# Patient Record
Sex: Male | Born: 1970 | Race: White | Hispanic: No | Marital: Married | Smoking: Never smoker
Health system: Southern US, Community
[De-identification: ages and names within clinical notes are randomized; demographics above are authoritative.]

## PROBLEM LIST (undated history)

## (undated) DIAGNOSIS — G43909 Migraine, unspecified, not intractable, without status migrainosus: Secondary | ICD-10-CM

## (undated) DIAGNOSIS — B019 Varicella without complication: Secondary | ICD-10-CM

## (undated) DIAGNOSIS — R519 Headache, unspecified: Secondary | ICD-10-CM

## (undated) DIAGNOSIS — R51 Headache: Secondary | ICD-10-CM

## (undated) DIAGNOSIS — R12 Heartburn: Secondary | ICD-10-CM

## (undated) HISTORY — DX: Heartburn: R12

## (undated) HISTORY — DX: Headache: R51

## (undated) HISTORY — DX: Headache, unspecified: R51.9

## (undated) HISTORY — PX: WISDOM TOOTH EXTRACTION: SHX21

## (undated) HISTORY — DX: Varicella without complication: B01.9

## (undated) HISTORY — DX: Migraine, unspecified, not intractable, without status migrainosus: G43.909

---
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
## 1993-01-24 HISTORY — PX: EYE SURGERY: SHX253

## 2008-12-22 ENCOUNTER — Ambulatory Visit: Payer: Self-pay | Admitting: Ophthalmology

## 2015-01-09 ENCOUNTER — Encounter: Payer: Self-pay | Admitting: Internal Medicine

## 2015-01-09 ENCOUNTER — Ambulatory Visit (INDEPENDENT_AMBULATORY_CARE_PROVIDER_SITE_OTHER): Payer: BLUE CROSS/BLUE SHIELD | Admitting: Internal Medicine

## 2015-01-09 VITALS — BP 130/82 | HR 77 | Temp 97.9°F | Ht 67.5 in | Wt 187.2 lb

## 2015-01-09 DIAGNOSIS — Z0000 Encounter for general adult medical examination without abnormal findings: Secondary | ICD-10-CM | POA: Diagnosis not present

## 2015-01-09 DIAGNOSIS — K219 Gastro-esophageal reflux disease without esophagitis: Secondary | ICD-10-CM | POA: Insufficient documentation

## 2015-01-09 DIAGNOSIS — R51 Headache: Secondary | ICD-10-CM

## 2015-01-09 DIAGNOSIS — R519 Headache, unspecified: Secondary | ICD-10-CM

## 2015-01-09 LAB — COMPREHENSIVE METABOLIC PANEL
ALK PHOS: 78 U/L (ref 40–115)
ALT: 20 U/L (ref 9–46)
AST: 17 U/L (ref 10–40)
Albumin: 4.3 g/dL (ref 3.6–5.1)
BILIRUBIN TOTAL: 0.9 mg/dL (ref 0.2–1.2)
BUN: 17 mg/dL (ref 7–25)
CO2: 25 mmol/L (ref 20–31)
CREATININE: 1.04 mg/dL (ref 0.60–1.35)
Calcium: 9.1 mg/dL (ref 8.6–10.3)
Chloride: 103 mmol/L (ref 98–110)
Glucose, Bld: 101 mg/dL — ABNORMAL HIGH (ref 65–99)
POTASSIUM: 3.9 mmol/L (ref 3.5–5.3)
SODIUM: 137 mmol/L (ref 135–146)
TOTAL PROTEIN: 7.1 g/dL (ref 6.1–8.1)

## 2015-01-09 LAB — LIPID PANEL
CHOLESTEROL: 208 mg/dL — AB (ref 125–200)
HDL: 70 mg/dL (ref >=40)
LDL Cholesterol: 112 mg/dL (ref <130)
Total CHOL/HDL Ratio: 3 Ratio (ref <=5.0)
Triglycerides: 131 mg/dL (ref <150)
VLDL: 26 mg/dL (ref <30)

## 2015-01-09 LAB — CBC WITH DIFFERENTIAL/PLATELET
Basophils Absolute: 0.1 10*3/uL (ref 0.0–0.1)
Basophils Relative: 1 % (ref 0–1)
Eosinophils Absolute: 0.1 10*3/uL (ref 0.0–0.7)
Eosinophils Relative: 1 % (ref 0–5)
HCT: 43.6 % (ref 39.0–52.0)
HEMOGLOBIN: 15.8 g/dL (ref 13.0–17.0)
LYMPHS ABS: 2 10*3/uL (ref 0.7–4.0)
LYMPHS PCT: 34 % (ref 12–46)
MCH: 32.8 pg (ref 26.0–34.0)
MCHC: 36.2 g/dL — ABNORMAL HIGH (ref 30.0–36.0)
MCV: 90.5 fL (ref 78.0–100.0)
MONO ABS: 0.5 10*3/uL (ref 0.1–1.0)
MONOS PCT: 8 % (ref 3–12)
MPV: 10 fL (ref 8.6–12.4)
NEUTROS ABS: 3.2 10*3/uL (ref 1.7–7.7)
Neutrophils Relative %: 56 % (ref 43–77)
PLATELETS: 195 10*3/uL (ref 150–400)
RBC: 4.82 MIL/uL (ref 4.22–5.81)
RDW: 13.1 % (ref 11.5–15.5)
WBC: 5.8 10*3/uL (ref 4.0–10.5)

## 2015-01-09 LAB — TSH: TSH: 1.626 u[IU]/mL (ref 0.350–4.500)

## 2015-01-09 NOTE — Assessment & Plan Note (Signed)
Mild intermittent symptoms of GERD, improved with occasional. TUMs. He will call if any persistent or worsening symptoms. Discussed limiting intake of spicy and fried foods.

## 2015-01-09 NOTE — Assessment & Plan Note (Signed)
Occasional mild headache. Last over 1 month ago. Likely tension headaches, exacerbated by eye strain, now improved. He will call if any persistent or worsening symptoms. Continue prn Tylenol for occasional HA.

## 2015-01-09 NOTE — Patient Instructions (Signed)
Follow up 1 year 

## 2015-01-09 NOTE — Progress Notes (Signed)
Pre visit review using our clinic review tool, if applicable. No additional management support is needed unless otherwise documented below in the visit note. 

## 2015-01-09 NOTE — Progress Notes (Signed)
Subjective:    Patient ID: Scott Murray, male    DOB: 1970-05-11, 44 y.o.   MRN: [id number]  HPI  44YO male presents to establish care.  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 Sleeps about 6-7 hr per night.  GERD - Occasional heartburn. Takes Tums occasionally. Occasional fullness in epigastric area after eating. No specific food triggers. No NV. No diarrhea.  Headaches - Diffuse headaches on occasion. Less than once per month. Improved after getting new glasses. Improves with Tylenol.  Follows a regular diet. No food allergies. Physically active in work.  Lives in [place]. Has 3 children. 21yo, 12yo and 10yo. Has 3 dogs in home.  Treated for "cat scratch" fever in past.   Wt Readings from Last 3 Encounters:  01/09/15 187 lb 4 oz (84.936 kg)   BP Readings from Last 3 Encounters:  01/09/15 130/82    Past Medical History  Diagnosis Date  . Chicken pox   . Heartburn   . Frequent headaches   . Migraines    Family History  Problem Relation Age of Onset  . Hypertension Father   . Heart disease Paternal Grandmother    Past Surgical History  Procedure Laterality Date  . Eye surgery  1982  . Eye surgery  1995  . Wisdom tooth extraction     Social History   Social History  . Marital Status: Married    Spouse Name: N/A  . Number of Children: N/A  . Years of Education: N/A   Social History Main Topics  . Smoking status: Never Smoker   . Smokeless tobacco: Never Used  . Alcohol Use: 2.4 oz/week    4 Cans of beer per week  . Drug Use: No  . Sexual Activity: Yes   Other Topics Concern  . None   Social History Narrative   Lives in [place]. Has 3 children. 21yo, 12yo and 10yo.   Has 3 dogs in home.      Works as an Clinical biochemist.      Follows regular diet.    Review of Systems  Constitutional: Negative for fever, chills, activity change, appetite change, fatigue and unexpected weight change.  Eyes: Negative for visual  disturbance.  Respiratory: Negative for cough and shortness of breath.   Cardiovascular: Negative for chest pain, palpitations and leg swelling.  Gastrointestinal: Negative for nausea, vomiting, abdominal pain, diarrhea, constipation and abdominal distention.  Genitourinary: Negative for dysuria, urgency and difficulty urinating.  Musculoskeletal: Negative for arthralgias and gait problem.  Skin: Negative for color change and rash.  Hematological: Negative for adenopathy.  Psychiatric/Behavioral: Negative for sleep disturbance and dysphoric mood. The patient is not nervous/anxious.        Objective:    BP 130/82 mmHg  Pulse 77  Temp(Src) 97.9 F (36.6 C) (Oral)  Ht 5' 7.5" (1.715 m)  Wt 187 lb 4 oz (84.936 kg)  BMI 28.88 kg/m2  SpO2 98% Physical Exam  Constitutional: He is oriented to person, place, and time. He appears well-developed and well-nourished. No distress.  HENT:  Head: Normocephalic and atraumatic.  Right Ear: External ear normal.  Left Ear: External ear normal.  Nose: Nose normal.  Mouth/Throat: Oropharynx is clear and moist. No oropharyngeal exudate.  Eyes: Conjunctivae and EOM are normal. Pupils are equal, round, and reactive to light. Right eye exhibits no discharge. Left eye exhibits no discharge. No scleral icterus.  Neck: Normal range of motion. Neck supple. No tracheal deviation present.  No thyromegaly present.  Cardiovascular: Normal rate, regular rhythm and normal heart sounds.  Exam reveals no gallop and no friction rub.   No murmur heard. Pulmonary/Chest: Effort normal and breath sounds normal. No respiratory distress. He has no wheezes. He has no rales. He exhibits no tenderness.  Abdominal: Soft. Bowel sounds are normal. He exhibits no distension and no mass. There is no tenderness. There is no rebound and no guarding.  Musculoskeletal: Normal range of motion. He exhibits no edema.  Lymphadenopathy:    He has no cervical adenopathy.  Neurological: He  is alert and oriented to person, place, and time. No cranial nerve deficit. Coordination normal.  Skin: Skin is warm and dry. No rash noted. He is not diaphoretic. No erythema. No pallor.  Psychiatric: He has a normal mood and affect. His behavior is normal. Judgment and thought content normal.          Assessment & Plan:   Problem List Items Addressed This Visit      Unprioritized   Cephalalgia    Occasional mild headache. Last over 1 month ago. Likely tension headaches, exacerbated by eye strain, now improved. He will call if any persistent or worsening symptoms. Continue prn Tylenol for occasional HA.      Esophageal reflux - Primary    Mild intermittent symptoms of GERD, improved with occasional. TUMs. He will call if any persistent or worsening symptoms. Discussed limiting intake of spicy and fried foods.       Other Visit Diagnoses    Routine general medical examination at a health care facility        Relevant Orders    CBC with Differential/Platelet    Comprehensive metabolic panel    Lipid panel    Microalbumin / creatinine urine ratio    TSH    CBC with Differential    Comp Met (CMET)    Lipid Profile    TSH    Urine Microalbumin w/creat. ratio        Return in about 1 year (around 01/09/2016) for Physical.

## 2015-01-10 LAB — MICROALBUMIN / CREATININE URINE RATIO
CREATININE, URINE: 136 mg/dL (ref 20–370)
MICROALB UR: 0.5 mg/dL
MICROALB/CREAT RATIO: 4 ug/mg{creat} (ref <30)

## 2015-01-12 ENCOUNTER — Encounter: Payer: Self-pay | Admitting: *Deleted

## 2016-01-13 ENCOUNTER — Encounter: Payer: Self-pay | Admitting: Internal Medicine

## 2016-03-01 ENCOUNTER — Ambulatory Visit (INDEPENDENT_AMBULATORY_CARE_PROVIDER_SITE_OTHER): Payer: BLUE CROSS/BLUE SHIELD | Admitting: Family

## 2016-03-01 ENCOUNTER — Encounter: Payer: Self-pay | Admitting: Family

## 2016-03-01 VITALS — BP 124/86 | HR 85 | Temp 98.9°F | Resp 16 | Wt 185.4 lb

## 2016-03-01 DIAGNOSIS — R0981 Nasal congestion: Secondary | ICD-10-CM | POA: Diagnosis not present

## 2016-03-01 LAB — POCT INFLUENZA A/B
INFLUENZA A, POC: NEGATIVE
INFLUENZA B, POC: NEGATIVE

## 2016-03-01 MED ORDER — BENZONATATE 100 MG PO CAPS: 100.0000 mg | ORAL_CAPSULE | Freq: Two times a day (BID) | ORAL | 0 refills | Status: DC | PRN

## 2016-03-01 NOTE — Progress Notes (Signed)
Subjective:    Patient ID: Scott Murray, male    DOB: 1970-05-19, 46 y.o.   MRN: [id number]  CC: Scott Murray is a 46 y.o. male who presents today for an acute visit.    HPI: Chief complaint of  Sinus congestion x one day. Endorses productive cough over this past week as well, waxing and waning. Reports 4 days ago had fever, tmax 101, resolved. Had tried OTC cold and flu, tyenol few days ago with resolve. Last night 'felt like cold was coming back' with sinus pressure. No ear pain, wheezing, SOB.    no lung disease or smoking  Daughter had flu 3 weeks ago.     HISTORY:  Past Medical History:  Diagnosis Date  . Chicken pox   . Frequent headaches   . Heartburn   . Migraines    Past Surgical History:  Procedure Laterality Date  . EYE SURGERY  1982  . EYE SURGERY  1995  . WISDOM TOOTH EXTRACTION     Family History  Problem Relation Age of Onset  . Hypertension Father   . Heart disease Paternal Grandmother     Allergies: Patient has no known allergies. No current outpatient prescriptions on file prior to visit.   No current facility-administered medications on file prior to visit.     Social History  Substance Use Topics  . Smoking status: Never Smoker  . Smokeless tobacco: Never Used  . Alcohol use 2.4 oz/week    4 Cans of beer per week    Review of Systems  Constitutional: Negative for chills and fever.  HENT: Positive for congestion and sinus pressure. Negative for sore throat.   Respiratory: Positive for cough. Negative for shortness of breath and wheezing.   Cardiovascular: Negative for chest pain and palpitations.  Gastrointestinal: Negative for nausea and vomiting.      Objective:    BP 124/86 (BP Location: Left Arm, Patient Position: Sitting, Cuff Size: Normal)   Pulse 85   Temp 98.9 F (37.2 C) (Oral)   Resp 16   Wt 185 lb 6 oz (84.1 kg)   SpO2 97%   BMI 28.61 kg/m    Physical Exam  Constitutional: Vital signs are normal. He appears  well-developed and well-nourished.  HENT:  Head: Normocephalic and atraumatic.  Right Ear: Hearing, tympanic membrane, external ear and ear canal normal. No drainage, swelling or tenderness. Tympanic membrane is not injected, not erythematous and not bulging. No middle ear effusion. No decreased hearing is noted.  Left Ear: Hearing, tympanic membrane, external ear and ear canal normal. No drainage, swelling or tenderness. Tympanic membrane is not injected, not erythematous and not bulging.  No middle ear effusion. No decreased hearing is noted.  Nose: Nose normal. Right sinus exhibits no maxillary sinus tenderness and no frontal sinus tenderness. Left sinus exhibits no maxillary sinus tenderness and no frontal sinus tenderness.  Mouth/Throat: Uvula is midline, oropharynx is clear and moist and mucous membranes are normal. No oropharyngeal exudate, posterior oropharyngeal edema, posterior oropharyngeal erythema or tonsillar abscesses.  Eyes: Conjunctivae are normal.  Cardiovascular: Regular rhythm and normal heart sounds.   Pulmonary/Chest: Effort normal and breath sounds normal. No respiratory distress. He has no wheezes. He has no rhonchi. He has no rales.  Lymphadenopathy:       Head (right side): No submental, no submandibular, no tonsillar, no preauricular, no posterior auricular and no occipital adenopathy present.       Head (left side): No submental, no submandibular,  no tonsillar, no preauricular, no posterior auricular and no occipital adenopathy present.    He has no cervical adenopathy.  Neurological: He is alert.  Skin: Skin is warm and dry.  Psychiatric: He has a normal mood and affect. His speech is normal and behavior is normal.  Vitals reviewed.      Assessment & Plan:  1. Sinus congestion Negative flu. Based on history of present illness, it appears patient had a flulike illness over week ago which is largely resolved. Yesterday sinus pressure returned. Afebrile today. We  jointly agreed that we would trial conservative measures first as appropriate, patient will start Mucinex over-the-counter and Tessalon Perles as needed for cough. He will let me know in a couple days if no improvement at that time; if no improvement, we will start an antibiotic.   - POCT Influenza A/B - benzonatate (TESSALON) 100 MG capsule; Take 1 capsule (100 mg total) by mouth 2 (two) times daily as needed for cough.  Dispense: 20 capsule; Refill: 0     Scott Murray does not currently have medications on file.   No orders of the defined types were placed in this encounter.   Return precautions given.   Risks, benefits, and alternatives of the medications and treatment plan prescribed today were discussed, and patient expressed understanding.   Education regarding symptom management and diagnosis given to patient on AVS.  Continue to follow with Wynona DoveWALKER,JENNIFER AZBELL, MD for routine health maintenance.   [name] and I agreed with plan.   Rennie PlowmanMargaret Arnett, FNP

## 2016-03-01 NOTE — Patient Instructions (Signed)
As discussed, mucinex with lots of water for a couple of days. Be sure to buy PLAIN mucinex.   Tessalon perles as needed for cough. Honey as well.    Try this for a couple of days and let me know if not better.  If there is no improvement in your symptoms, or if there is any worsening of symptoms, or if you have any additional concerns, please return for re-evaluation; or, if we are closed, consider going to the Emergency Room for evaluation if symptoms urgent.

## 2016-03-02 ENCOUNTER — Encounter: Payer: Self-pay | Admitting: Family

## 2017-04-04 ENCOUNTER — Other Ambulatory Visit: Payer: Self-pay

## 2017-04-04 ENCOUNTER — Ambulatory Visit
Admission: EM | Admit: 2017-04-04 | Discharge: 2017-04-04 | Disposition: A | Discharge status: Home / Self Care | Payer: BLUE CROSS/BLUE SHIELD | Attending: Family Medicine | Admitting: Family Medicine

## 2017-04-04 DIAGNOSIS — H9312 Tinnitus, left ear: Secondary | ICD-10-CM

## 2017-04-04 MED ORDER — FLUTICASONE PROPIONATE 50 MCG/ACT NA SUSP: 2.0000 | Freq: Every day | NASAL | 0 refills | Status: AC

## 2017-04-04 NOTE — ED Provider Notes (Signed)
[facility]    CSN: [id number] Arrival date & time: 04/04/17  0818  History   Chief Complaint Chief Complaint  Patient presents with  . Otalgia    Left   HPI  47 year old male presents with left ear pressure and ringing in ear.  Started on Sunday.  Has been having pressure and ringing in the ear.  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  He states that he has some nasal congestion.  He has no other respiratory symptoms.  No known exacerbating or relieving factors.  No medications or interventions tried.  No other complaints or concerns at this time.  Past Medical History:  Diagnosis Date  . Chicken pox   . Frequent headaches   . Heartburn   . Migraines    Patient Active Problem List   Diagnosis Date Noted  . Esophageal reflux 01/09/2015  . Cephalalgia 01/09/2015   Past Surgical History:  Procedure Laterality Date  . EYE SURGERY  1982  . EYE SURGERY  1995  . WISDOM TOOTH EXTRACTION     Home Medications    Prior to Admission medications   Medication Sig Start Date End Date Taking? Authorizing Provider  fluticasone (FLONASE) 50 MCG/ACT nasal spray Place 2 sprays into both nostrils daily. 04/04/17   Tommie Samsook, Damarian Priola G, DO    Family History Family History  Problem Relation Age of Onset  . Hypertension Father   . Heart disease Paternal Grandmother     Social History Social History   Tobacco Use  . Smoking status: Never Smoker  . Smokeless tobacco: Never Used  Substance Use Topics  . Alcohol use: Yes    Alcohol/week: 2.4 oz    Types: 4 Cans of beer per week  . Drug use: No     Allergies   Patient has no known allergies.   Review of Systems Review of Systems  Constitutional: Negative.   HENT: Positive for congestion and tinnitus.    Physical Exam Triage Vital Signs ED Triage Vitals  Enc Vitals Group     BP 04/04/17 0832 131/86     Pulse Rate 04/04/17 0832 75   Resp 04/04/17 0832 18     Temp 04/04/17 0832 97.8 F (36.6 C)     Temp Source 04/04/17 0832 Oral     SpO2 04/04/17 0832 100 %     Weight 04/04/17 0831 175 lb (79.4 kg)     Height 04/04/17 0831 5\' 8"  (1.727 m)     Head Circumference --      Peak Flow --      Pain Score 04/04/17 0830 1     Pain Loc --      Pain Edu? --      Excl. in GC? --    Updated Vital Signs BP 131/86 (BP Location: Right Arm)   Pulse 75   Temp 97.8 F (36.6 C) (Oral)   Resp 18   Ht 5\' 8"  (1.727 m)   Wt 175 lb (79.4 kg)   SpO2 100%   BMI 26.61 kg/m   Physical Exam  Constitutional: He is oriented to person, place, and time. He appears well-developed. No distress.  HENT:  Head: Normocephalic and atraumatic.  Nose: Nose normal.  Normal TM's bilaterally.  Eyes: Conjunctivae are normal. Right eye exhibits no discharge. Left eye exhibits no discharge.  Neck: Neck supple.  Cardiovascular: Normal rate and regular rhythm.  Pulmonary/Chest: Effort normal and breath sounds normal. He has no wheezes. He has no rales.  Lymphadenopathy:    He has no cervical adenopathy.  Neurological: He is alert and oriented to person, place, and time.  Psychiatric: He has a normal mood and affect. His behavior is normal.  Nursing note and vitals reviewed.  UC Treatments / Results  Labs (all labs ordered are listed, but only abnormal results are displayed) Labs Reviewed - No data to display  EKG  EKG Interpretation None       Radiology No results found.  Procedures Procedures (including critical care time)  Medications Ordered in UC Medications - No data to display   Initial Impression / Assessment and Plan / UC Course  I have reviewed the triage vital signs and the nursing notes.  Pertinent labs & imaging results that were available during my care of the patient were reviewed by me and considered in my medical decision making (see chart for details).    47 year old with male presents with ear pressure and  tenderness.  Exam unremarkable.  Treating with Flonase.  Advised to see ENT.  Final Clinical Impressions(s) / UC Diagnoses   Final diagnoses:  Tinnitus of left ear    ED Discharge Orders        Ordered    fluticasone (FLONASE) 50 MCG/ACT nasal spray  Daily     04/04/17 0847     Controlled Substance Prescriptions [place] Controlled Substance Registry consulted? Not Applicable   Tommie Sams, [place] 04/04/17 [phone]

## 2017-04-04 NOTE — Discharge Instructions (Signed)
Call ENT  Flonase will help with congestion and may help with the tinnitus.  Take care  Dr. Adriana Simasook

## 2017-04-04 NOTE — ED Triage Notes (Signed)
Patient complains of left ear pain that started on Sunday. Patient states that he has been noticing ringing or pressure in the ear.

## 2017-04-13 DIAGNOSIS — H9122 Sudden idiopathic hearing loss, left ear: Secondary | ICD-10-CM | POA: Diagnosis not present

## 2017-04-13 DIAGNOSIS — H9319 Tinnitus, unspecified ear: Secondary | ICD-10-CM | POA: Diagnosis not present

## 2017-04-13 DIAGNOSIS — H9312 Tinnitus, left ear: Secondary | ICD-10-CM | POA: Diagnosis not present

## 2017-04-20 ENCOUNTER — Other Ambulatory Visit: Payer: Self-pay | Admitting: Otolaryngology

## 2017-04-20 DIAGNOSIS — H9312 Tinnitus, left ear: Secondary | ICD-10-CM

## 2017-04-20 DIAGNOSIS — H905 Unspecified sensorineural hearing loss: Secondary | ICD-10-CM | POA: Diagnosis not present

## 2017-04-20 DIAGNOSIS — R42 Dizziness and giddiness: Secondary | ICD-10-CM | POA: Diagnosis not present

## 2017-04-20 DIAGNOSIS — H9042 Sensorineural hearing loss, unilateral, left ear, with unrestricted hearing on the contralateral side: Secondary | ICD-10-CM

## 2017-04-28 ENCOUNTER — Ambulatory Visit
Admission: RE | Admit: 2017-04-28 | Discharge: 2017-04-28 | Disposition: A | Discharge status: Home / Self Care | Payer: BLUE CROSS/BLUE SHIELD | Source: Ambulatory Visit | Attending: Otolaryngology | Admitting: Otolaryngology

## 2017-04-28 DIAGNOSIS — H9042 Sensorineural hearing loss, unilateral, left ear, with unrestricted hearing on the contralateral side: Secondary | ICD-10-CM | POA: Diagnosis not present

## 2017-04-28 DIAGNOSIS — H9312 Tinnitus, left ear: Secondary | ICD-10-CM | POA: Diagnosis not present

## 2017-04-28 MED ORDER — GADOBENATE DIMEGLUMINE 529 MG/ML IV SOLN
16.0000 mL | Freq: Once | INTRAVENOUS | Status: AC | PRN
  Administered 2017-04-28: 16 mL via INTRAVENOUS

## 2017-05-10 ENCOUNTER — Other Ambulatory Visit: Payer: Self-pay | Admitting: Family Medicine

## 2017-05-29 DIAGNOSIS — R42 Dizziness and giddiness: Secondary | ICD-10-CM | POA: Diagnosis not present

## 2017-06-26 DIAGNOSIS — R42 Dizziness and giddiness: Secondary | ICD-10-CM | POA: Diagnosis not present

## 2017-06-26 DIAGNOSIS — H9312 Tinnitus, left ear: Secondary | ICD-10-CM | POA: Diagnosis not present

## 2018-02-23 DIAGNOSIS — H9312 Tinnitus, left ear: Secondary | ICD-10-CM | POA: Diagnosis not present

## 2018-02-23 DIAGNOSIS — H8103 Meniere's disease, bilateral: Secondary | ICD-10-CM | POA: Diagnosis not present

## 2018-11-05 IMAGING — MR MR BRAIN/IAC WO/W
11 of 12 series · 40 of 48 positions shown · IV contrast (multihance)
Comparison: MRI head 12/22/2008

CLINICAL DATA: Sensorineural hearing loss left ear, tinnitus left
ear

EXAM:
MRI HEAD WITHOUT AND WITH CONTRAST
TECHNIQUE: Multiplanar, multiecho pulse sequences of the brain and surrounding
structures were obtained without and with intravenous contrast.
CONTRAST:  16mL MULTIHANCE GADOBENATE DIMEGLUMINE 529 MG/ML IV SOLN

[Series 3: DWI · axial · 3.0mm · 1.20mm/px · z∈[-85,+76]mm · 8 of 55 slices shown (1 of 2)]
[im 1/55]
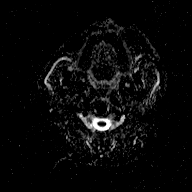
[im 8/55]
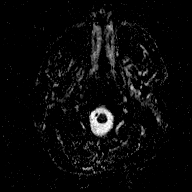
[im 16/55]
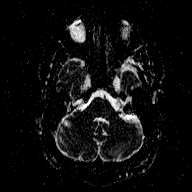
[im 24/55]
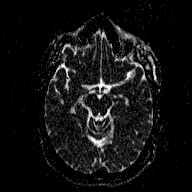
[im 31/55]
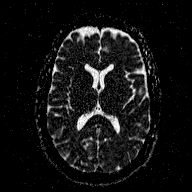
[im 39/55]
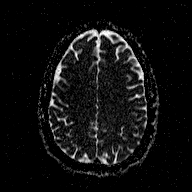
[im 47/55]
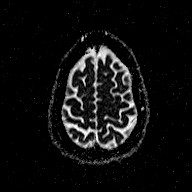
[im 55/55]
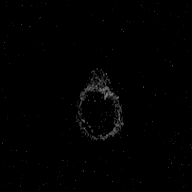

[Series 4: T1 · sagittal · 5.0mm · 0.45mm/px · 4 of 25 slices shown (1 of 3)]
[im 1/25]
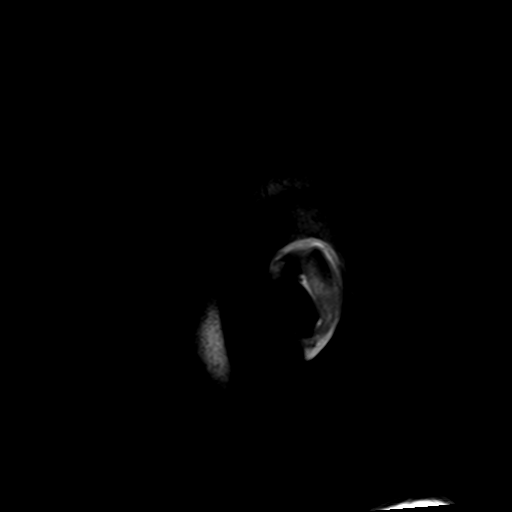
[im 9/25]
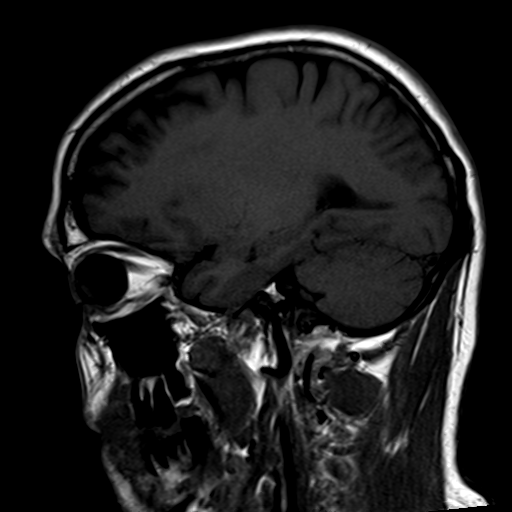
[im 17/25]
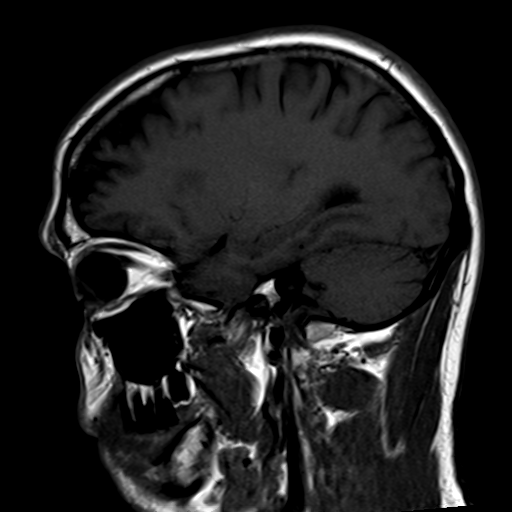
[im 25/25]
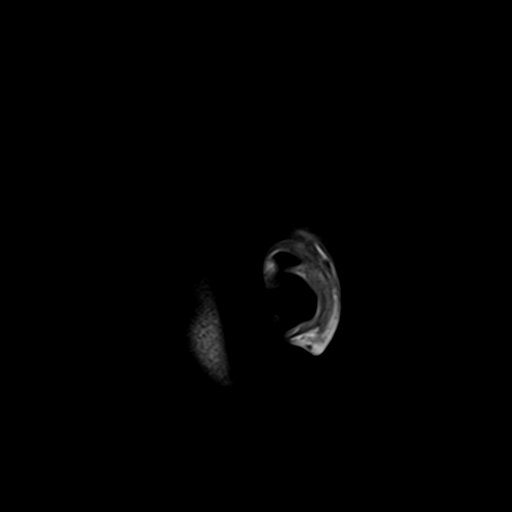

[Series 5: T2 · axial · 5.0mm · 0.72mm/px · z∈[-85,+77]mm · 3 of 26 slices shown (1 of 2)]
[im 1/26]
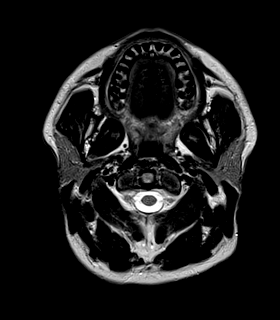
[im 13/26]
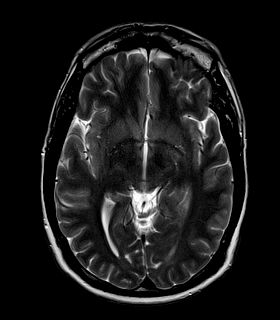
[im 26/26]
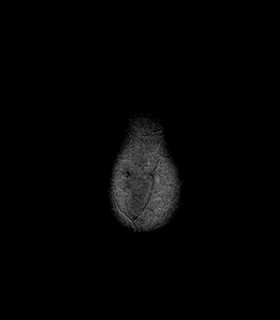

[Series 6: T2 · axial · 5.0mm · 0.72mm/px · z∈[-85,+77]mm · 3 of 26 slices shown (2 of 2)]
[im 1/26]
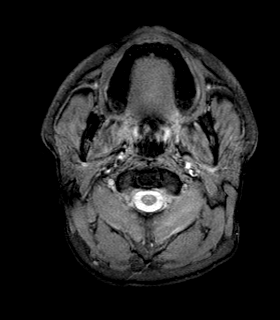
[im 13/26]
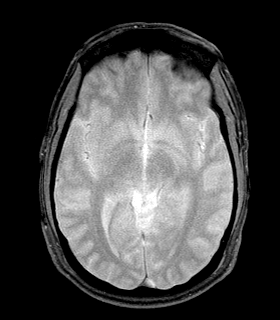
[im 26/26]
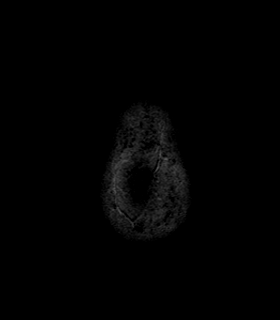

[Series 7: FLAIR · axial · 5.0mm · 0.45mm/px · z∈[-85,+77]mm · 3 of 26 slices shown]
[im 1/26]
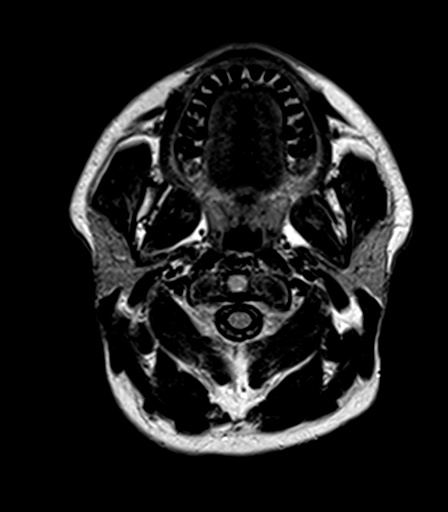
[im 13/26]
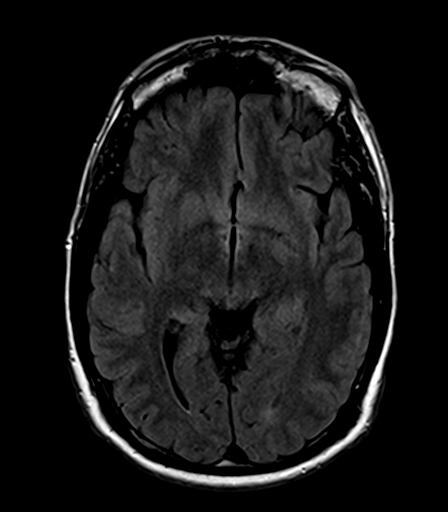
[im 26/26]
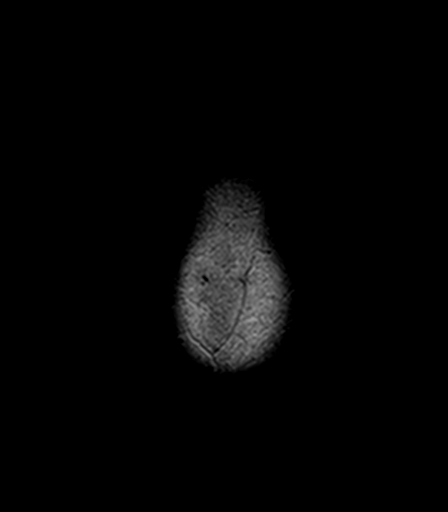

[Series 8: T1 · coronal · 3.0mm · 0.37mm/px · 1 of 11 slices shown (2 of 3)]
[im 1/11]
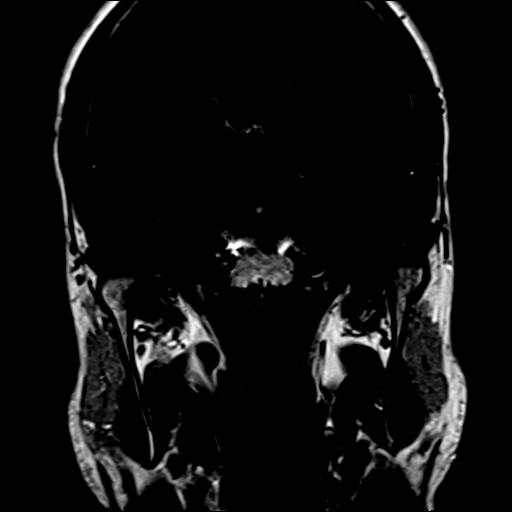

[Series 9: T1 · axial · 3.0mm · 0.37mm/px · 1 of 11 slices shown (3 of 3)]
[im 1/11]
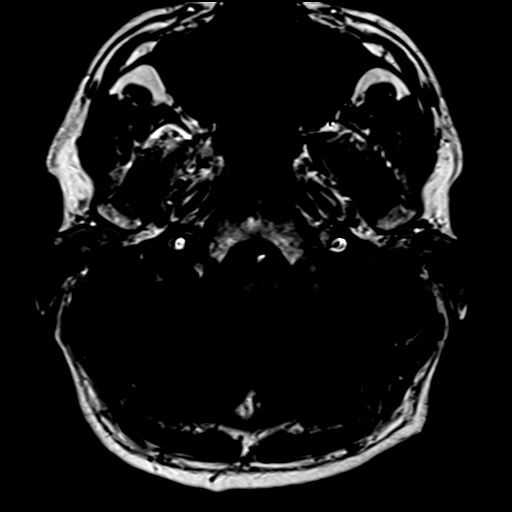

[Series 11: T1 post-contrast · axial · 3.0mm · 0.37mm/px · 1 of 11 slices shown (1 of 3)]
[im 1/11]
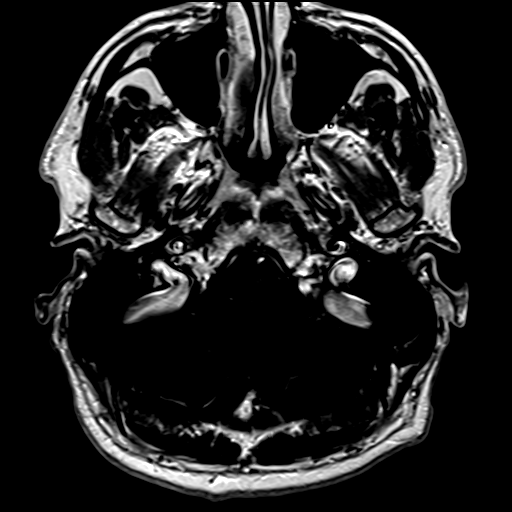

[Series 12: T1 post-contrast · coronal · 3.0mm · 0.37mm/px · 1 of 11 slices shown (2 of 3)]
[im 1/11]
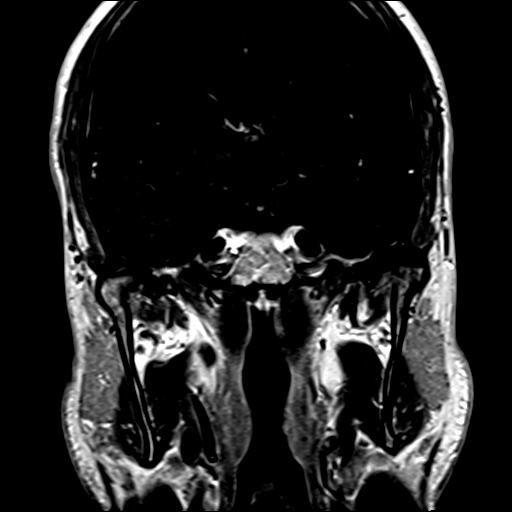

[Series 13: T1 post-contrast · axial · 3.0mm · 1.00mm/px · z∈[-94,+83]mm · 8 of 60 slices shown (3 of 3)]
[im 1/60]
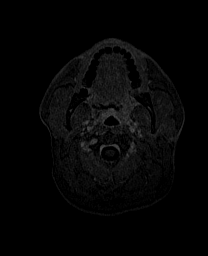
[im 9/60]
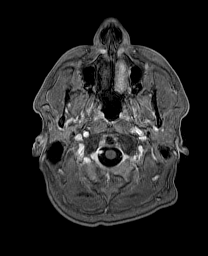
[im 17/60]
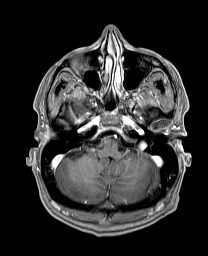
[im 26/60]
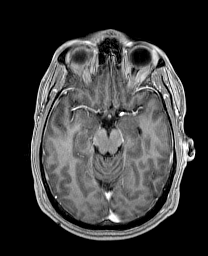
[im 34/60]
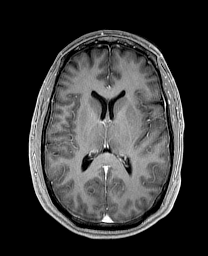
[im 43/60]
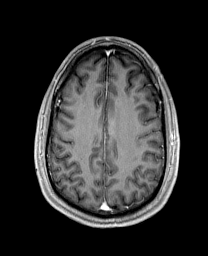
[im 51/60]
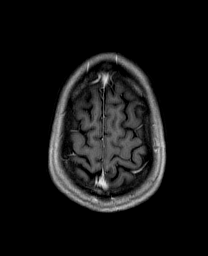
[im 60/60]
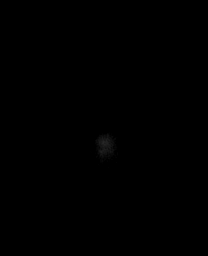

[Series 100: DWI · axial · 3.0mm · 1.20mm/px · z∈[-85,+76]mm · 7 of 55 slices shown (2 of 2)]
[im 1/55]
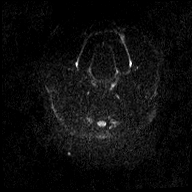
[im 10/55]
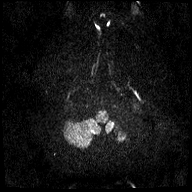
[im 19/55]
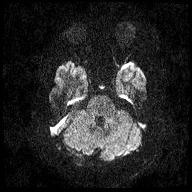
[im 28/55]
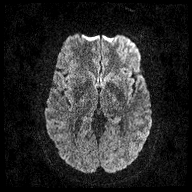
[im 37/55]
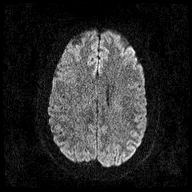
[im 46/55]
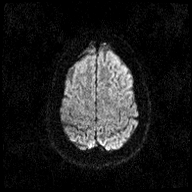
[im 55/55]
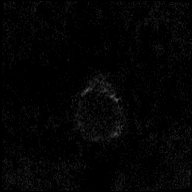

[40 of 48 positions shown; findings below may reference images not displayed]

FINDINGS: Brain: IAC protocol was performed including thin section imaging
through the posterior fossa before and after intravenous contrast.
Seventh and eighth cranial nerves normal. Negative for vestibular
schwannoma. Basilar cisterns normal. Brainstem and cerebellum
normal. Mastoid sinus clear. Normal enhancement of the temporal
bone. Normal enhancement of the transverse and sigmoid sinus
bilaterally.

Ventricle size normal. Negative for infarct hemorrhage or mass.
Normal white matter.

Vascular: Normal arterial flow void. Hypoplastic distal left
vertebral artery unchanged.

Skull and upper cervical spine: Negative

Sinuses/Orbits: Negative

Other: None
IMPRESSION: No cause for hearing loss or tinnitus

Normal MRI brain with contrast with special attention to the
posterior fossa.

## 2019-03-12 ENCOUNTER — Emergency Department
Admission: EM | Admit: 2019-03-12 | Discharge: 2019-03-12 | Disposition: A | Discharge status: Home / Self Care | Payer: PRIVATE HEALTH INSURANCE | Attending: Emergency Medicine | Admitting: Emergency Medicine

## 2019-03-12 ENCOUNTER — Emergency Department: Payer: PRIVATE HEALTH INSURANCE

## 2019-03-12 ENCOUNTER — Other Ambulatory Visit: Payer: Self-pay

## 2019-03-12 DIAGNOSIS — Y9389 Activity, other specified: Secondary | ICD-10-CM | POA: Insufficient documentation

## 2019-03-12 DIAGNOSIS — Z79899 Other long term (current) drug therapy: Secondary | ICD-10-CM | POA: Insufficient documentation

## 2019-03-12 DIAGNOSIS — S76112A Strain of left quadriceps muscle, fascia and tendon, initial encounter: Secondary | ICD-10-CM

## 2019-03-12 DIAGNOSIS — S86892A Other injury of other muscle(s) and tendon(s) at lower leg level, left leg, initial encounter: Secondary | ICD-10-CM | POA: Diagnosis not present

## 2019-03-12 DIAGNOSIS — W010XXA Fall on same level from slipping, tripping and stumbling without subsequent striking against object, initial encounter: Secondary | ICD-10-CM | POA: Insufficient documentation

## 2019-03-12 DIAGNOSIS — R791 Abnormal coagulation profile: Secondary | ICD-10-CM | POA: Diagnosis not present

## 2019-03-12 DIAGNOSIS — Z20822 Contact with and (suspected) exposure to covid-19: Secondary | ICD-10-CM | POA: Insufficient documentation

## 2019-03-12 DIAGNOSIS — Y990 Civilian activity done for income or pay: Secondary | ICD-10-CM | POA: Insufficient documentation

## 2019-03-12 DIAGNOSIS — Y9259 Other trade areas as the place of occurrence of the external cause: Secondary | ICD-10-CM | POA: Diagnosis not present

## 2019-03-12 DIAGNOSIS — S80912A Unspecified superficial injury of left knee, initial encounter: Secondary | ICD-10-CM | POA: Diagnosis present

## 2019-03-12 LAB — CBC WITH DIFFERENTIAL/PLATELET
Abs Immature Granulocytes: 0.04 10*3/uL (ref 0.00–0.07)
Basophils Absolute: 0.1 10*3/uL (ref 0.0–0.1)
Basophils Relative: 1 %
Eosinophils Absolute: 0 10*3/uL (ref 0.0–0.5)
Eosinophils Relative: 0 %
HCT: 42.3 % (ref 39.0–52.0)
Hemoglobin: 15.5 g/dL (ref 13.0–17.0)
Immature Granulocytes: 0 %
Lymphocytes Relative: 14 %
Lymphs Abs: 1.6 10*3/uL (ref 0.7–4.0)
MCH: 32.4 pg (ref 26.0–34.0)
MCHC: 36.6 g/dL — ABNORMAL HIGH (ref 30.0–36.0)
MCV: 88.5 fL (ref 80.0–100.0)
Monocytes Absolute: 0.8 10*3/uL (ref 0.1–1.0)
Monocytes Relative: 6 %
Neutro Abs: 9.4 10*3/uL — ABNORMAL HIGH (ref 1.7–7.7)
Neutrophils Relative %: 79 %
Platelets: 205 10*3/uL (ref 150–400)
RBC: 4.78 MIL/uL (ref 4.22–5.81)
RDW: 11.9 % (ref 11.5–15.5)
WBC: 11.9 10*3/uL — ABNORMAL HIGH (ref 4.0–10.5)
nRBC: 0 % (ref 0.0–0.2)

## 2019-03-12 LAB — PROTIME-INR
INR: 1.1 (ref 0.8–1.2)
Prothrombin Time: 13.6 seconds (ref 11.4–15.2)

## 2019-03-12 LAB — COMPREHENSIVE METABOLIC PANEL
ALT: 24 U/L (ref 0–44)
AST: 21 U/L (ref 15–41)
Albumin: 4.3 g/dL (ref 3.5–5.0)
Alkaline Phosphatase: 71 U/L (ref 38–126)
Anion gap: 5 (ref 5–15)
BUN: 14 mg/dL (ref 6–20)
CO2: 27 mmol/L (ref 22–32)
Calcium: 9.2 mg/dL (ref 8.9–10.3)
Chloride: 105 mmol/L (ref 98–111)
Creatinine, Ser: 1.01 mg/dL (ref 0.61–1.24)
GFR calc Af Amer: >60 mL/min (ref >60)
GFR calc non Af Amer: >60 mL/min (ref >60)
Glucose, Bld: 107 mg/dL — ABNORMAL HIGH (ref 70–99)
Potassium: 4.2 mmol/L (ref 3.5–5.1)
Sodium: 137 mmol/L (ref 135–145)
Total Bilirubin: 1.7 mg/dL — ABNORMAL HIGH (ref 0.3–1.2)
Total Protein: 7.2 g/dL (ref 6.5–8.1)

## 2019-03-12 LAB — RESPIRATORY PANEL BY RT PCR (FLU A&B, COVID)
Influenza A by PCR: NEGATIVE
Influenza B by PCR: NEGATIVE
SARS Coronavirus 2 by RT PCR: NEGATIVE

## 2019-03-12 MED ORDER — HYDROCODONE-ACETAMINOPHEN 5-325 MG PO TABS: 1.0000 | ORAL_TABLET | Freq: Three times a day (TID) | ORAL | 0 refills | Status: AC | PRN

## 2019-03-12 MED ORDER — CYCLOBENZAPRINE HCL 5 MG PO TABS: 5.0000 mg | ORAL_TABLET | Freq: Three times a day (TID) | ORAL | 0 refills | Status: AC | PRN

## 2019-03-12 NOTE — ED Triage Notes (Signed)
Left knee injury after falling at work.

## 2019-03-12 NOTE — Progress Notes (Signed)
I have reviewed imaging and exam findings with ED staff. I also talked to the patient over the phone regarding quadriceps tendon tear. He will plan to follow up with me tomorrow morning at 8am at [facility] in [place]. We will plan for quadriceps tendon repair later this week. Patient voiced understanding of the plan and is in agreement. He can be discharged with a knee immobilizer and crutches.

## 2019-03-12 NOTE — ED Notes (Signed)
See triage note  Presents with left knee pain  States he developed pain to left knee after falling  Unable to bear full wt

## 2019-03-12 NOTE — ED Provider Notes (Signed)
[facility] Emergency Department Provider Note ____________________________________________  Time seen: 1858  I have reviewed the triage vital signs and the nursing notes.  HISTORY  Chief Complaint  Knee Pain  HPI Scott Murray is a 49 y.o. male Presents to the ED for evaluation of acute left knee pain following a fall at work.   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  He describes landing on both knees.  He reports primarily having pain and disability immediately to the left knee.  Patient presents to the ED with left knee pain and effusion.  He reports pain with attempts to bend the knee.  Denies any other injury at this time.  Past Medical History:  Diagnosis Date  . Chicken pox   . Frequent headaches   . Heartburn   . Migraines     Patient Active Problem List   Diagnosis Date Noted  . Esophageal reflux 01/09/2015  . Cephalalgia 01/09/2015    Past Surgical History:  Procedure Laterality Date  . EYE SURGERY  1982  . EYE SURGERY  1995  . WISDOM TOOTH EXTRACTION      Prior to Admission medications   Medication Sig Start Date End Date Taking? Authorizing Provider  cyclobenzaprine (FLEXERIL) 5 MG tablet Take 1 tablet (5 mg total) by mouth 3 (three) times daily as needed. 03/12/19   Quinn Quam, Dannielle Karvonen, PA-C  fluticasone (FLONASE) 50 MCG/ACT nasal spray Place 2 sprays into both nostrils daily. 04/04/17   Coral Spikes, DO  HYDROcodone-acetaminophen (NORCO) 5-325 MG tablet Take 1 tablet by mouth 3 (three) times daily as needed for up to 3 days. 03/12/19 03/15/19  Jakory Matsuo, Dannielle Karvonen, PA-C    Allergies Patient has no known allergies.  Family History  Problem Relation Age of Onset  . Hypertension Father   . Heart disease Paternal Grandmother     Social History Social History   Tobacco Use  . Smoking status: Never Smoker  .  Smokeless tobacco: Never Used  Substance Use Topics  . Alcohol use: Yes    Alcohol/week: 4.0 standard drinks    Types: 4 Cans of beer per week  . Drug use: No    Review of Systems  Constitutional: Negative for fever. Cardiovascular: Negative for chest pain. Respiratory: Negative for shortness of breath. Musculoskeletal: Negative for back pain.  Knee pain as above. Skin: Negative for rash. Neurological: Negative for headaches, focal weakness or numbness. ____________________________________________  PHYSICAL EXAM:  VITAL SIGNS: ED Triage Vitals  Enc Vitals Group     BP 03/12/19 1758 131/82     Pulse Rate 03/12/19 1758 79     Resp 03/12/19 1758 18     Temp 03/12/19 1758 98.2 F (36.8 C)     Temp Source 03/12/19 1758 Oral     SpO2 03/12/19 1758 99 %     Weight 03/12/19 1758 170 lb (77.1 kg)     Height 03/12/19 1758 5\' 8"  (1.727 m)     Head Circumference --      Peak Flow --      Pain Score 03/12/19 1808 5     Pain Loc --      Pain Edu? --      Excl. in [place]? --     Constitutional: Alert and oriented. Well appearing and in no distress. Head: Normocephalic and atraumatic. Eyes: Conjunctivae are normal. Normal extraocular movements  Cardiovascular: Normal rate, regular rhythm. Normal distal pulses. Respiratory: Normal respiratory effort. No wheezes/rales/rhonchi. Musculoskeletal: left knee with moderate effusion. Decreased quad muscle interaction. SLR deficit in supine . Nontender with normal range of motion in all extremities.  Neurologic:  Normal gross sensation. Normal speech and language. No gross focal neurologic deficits are appreciated. Skin:  Skin is warm, dry and intact. No rash noted. Psychiatric: Mood and affect are normal. Patient exhibits appropriate insight and judgment. ____________________________________________   RADIOLOGY  MRI Left Knee w/o CM  Preliminary report: distal quad tendon disruption on Ax T2 Fat Sat image.  DG Left  Knee  IMPRESSION: Ossific fragment at the superior pole of the patella is associated with apparent thickening of the quadriceps tendon. Quadriceps avulsion injury is a concern. Correlate clinically and consider follow-up MRI to further evaluate.  I, Lissa Hoard, personally viewed and evaluated these images (plain radiographs) as part of my medical decision making, as well as reviewing the written report by the radiologist. ____________________________________________  PROCEDURES  Ice pack Knee immobilizer  Crutches  Procedures ____________________________________________  INITIAL IMPRESSION / ASSESSMENT AND PLAN / ED COURSE  ----------------------------------------- 7:27 PM on 03/12/2019 ----------------------------------------- Page to S. Allena Katz, MD: will await MRI results. Requesting pre-op labs. Patient will be discharged in knee immobilizer/crutches. Allena Katz will see him in the office tomorrow, and plan for surgery by Friday.   Patient with ED evaluation of sudden left knee pain and disability. He will be treated with immobilization for his quad tendon rupture. Prescriptions for Norco and Flexeril are provided. A work note for the remainder of the week is provided.   [name] was evaluated in Emergency Department on 03/12/2019 for the symptoms described in the history of present illness. He was evaluated in the context of the global COVID-19 pandemic, which necessitated consideration that the patient might be at risk for infection with the SARS-CoV-2 virus that causes COVID-19. Institutional protocols and algorithms that pertain to the evaluation of patients at risk for COVID-19 are in a state of rapid change based on information released by regulatory bodies including the CDC and federal and state organizations. These policies and algorithms were followed during the patient's care in the ED.  I reviewed the patient's prescription history over the last 12 months in  the multi-state controlled substances database(s) that includes [place], [place], [place], [place], [place], [place], [place], [place], [place], [place], [place], [place], [place], and [place].  Results were notable for no RX history. ____________________________________________  FINAL CLINICAL IMPRESSION(S) / ED DIAGNOSES  Final diagnoses:  Avulsion of left patellar tendon, initial encounter  Quadriceps tendon rupture, left, initial encounter      Lissa Hoard, PA-C 03/12/19 2226    Emily Filbert, MD 03/13/19 [phone]

## 2019-03-12 NOTE — Discharge Instructions (Addendum)
Wear the knee immobilizer and use the crutches to get around. Rest with the leg elevated and apply ice to reduce swelling. Take the muscle relaxant as directed and the pain medicine as needed. Avoid any OTC anti-inflammatories until after surgery. Follow-up with Dr. Allena Katz tomorrow as planned.

## 2019-03-13 ENCOUNTER — Encounter: Payer: Self-pay | Admitting: Orthopedic Surgery

## 2019-03-13 ENCOUNTER — Other Ambulatory Visit: Payer: Self-pay | Admitting: Orthopedic Surgery

## 2019-03-14 NOTE — Anesthesia Preprocedure Evaluation (Addendum)
Anesthesia Evaluation  Patient identified by MRN, date of birth, ID band Patient awake    Reviewed: Allergy & Precautions, NPO status , Patient's Chart, lab work & pertinent test results  History of Anesthesia Complications Negative for: history of anesthetic complications  Airway Mallampati: II  TM Distance: >3 FB Neck ROM: Full    Dental  (+)    Pulmonary    breath sounds clear to auscultation       Cardiovascular (-) angina(-) DOE  Rhythm:Regular Rate:Normal     Neuro/Psych  Headaches,    GI/Hepatic GERD  Controlled,  Endo/Other    Renal/GU      Musculoskeletal   Abdominal   Peds  Hematology   Anesthesia Other Findings   Reproductive/Obstetrics                            Anesthesia Physical Anesthesia Plan  ASA: I  Anesthesia Plan: General   Post-op Pain Management:  Regional for Post-op pain   Induction: Intravenous  PONV Risk Score and Plan: 2 and Ondansetron, Dexamethasone, Treatment may vary due to age or medical condition and Midazolam  Airway Management Planned: Oral ETT  Additional Equipment:   Intra-op Plan:   Post-operative Plan: Extubation in OR  Informed Consent: I have reviewed the patients History and Physical, chart, labs and discussed the procedure including the risks, benefits and alternatives for the proposed anesthesia with the patient or authorized representative who has indicated his/her understanding and acceptance.       Plan Discussed with: CRNA and Anesthesiologist  Anesthesia Plan Comments:       Anesthesia Quick Evaluation

## 2019-03-15 ENCOUNTER — Encounter: Payer: Self-pay | Admitting: Orthopedic Surgery

## 2019-03-15 ENCOUNTER — Ambulatory Visit: Payer: PRIVATE HEALTH INSURANCE | Admitting: Anesthesiology

## 2019-03-15 ENCOUNTER — Ambulatory Visit
Admission: RE | Admit: 2019-03-15 | Discharge: 2019-03-15 | Disposition: A | Discharge status: Home / Self Care | Payer: PRIVATE HEALTH INSURANCE | Attending: Orthopedic Surgery | Admitting: Orthopedic Surgery

## 2019-03-15 ENCOUNTER — Encounter
Admission: RE | Disposition: A | Discharge status: Home / Self Care | Payer: Self-pay | Source: Home / Self Care | Attending: Orthopedic Surgery

## 2019-03-15 ENCOUNTER — Other Ambulatory Visit: Payer: Self-pay

## 2019-03-15 DIAGNOSIS — S76112A Strain of left quadriceps muscle, fascia and tendon, initial encounter: Secondary | ICD-10-CM | POA: Insufficient documentation

## 2019-03-15 DIAGNOSIS — W1789XA Other fall from one level to another, initial encounter: Secondary | ICD-10-CM | POA: Diagnosis not present

## 2019-03-15 DIAGNOSIS — Y990 Civilian activity done for income or pay: Secondary | ICD-10-CM | POA: Insufficient documentation

## 2019-03-15 HISTORY — PX: QUADRICEPS TENDON REPAIR: SHX756

## 2019-03-15 SURGERY — REPAIR, TENDON, QUADRICEPS
Anesthesia: General | Site: Leg Upper | Laterality: Left

## 2019-03-15 MED ORDER — LIDOCAINE HCL (CARDIAC) PF 100 MG/5ML IV SOSY
PREFILLED_SYRINGE | INTRAVENOUS | Status: DC | PRN
  Administered 2019-03-15: 50 mg via INTRATRACHEAL

## 2019-03-15 MED ORDER — PROMETHAZINE HCL 25 MG/ML IJ SOLN: 6.2500 mg | INTRAMUSCULAR | Status: DC | PRN

## 2019-03-15 MED ORDER — MIDAZOLAM HCL 5 MG/5ML IJ SOLN
INTRAMUSCULAR | Status: DC | PRN
  Administered 2019-03-15: 2 mg via INTRAVENOUS

## 2019-03-15 MED ORDER — CEFAZOLIN SODIUM-DEXTROSE 2-4 GM/100ML-% IV SOLN
2.0000 g | INTRAVENOUS | Status: AC
  Administered 2019-03-15: 2 g via INTRAVENOUS

## 2019-03-15 MED ORDER — HYDROMORPHONE HCL 1 MG/ML IJ SOLN: 0.2500 mg | INTRAMUSCULAR | Status: DC | PRN

## 2019-03-15 MED ORDER — CHLORHEXIDINE GLUCONATE 4 % EX LIQD: 60.0000 mL | Freq: Once | CUTANEOUS | Status: DC

## 2019-03-15 MED ORDER — FENTANYL CITRATE (PF) 100 MCG/2ML IJ SOLN
INTRAMUSCULAR | Status: DC | PRN
  Administered 2019-03-15 (×2): 25 ug via INTRAVENOUS
  Administered 2019-03-15: 50 ug via INTRAVENOUS
  Administered 2019-03-15: 25 ug via INTRAVENOUS
  Administered 2019-03-15: 50 ug via INTRAVENOUS
  Administered 2019-03-15: 25 ug via INTRAVENOUS

## 2019-03-15 MED ORDER — BUPIVACAINE LIPOSOME 1.3 % IJ SUSP
INTRAMUSCULAR | Status: DC | PRN
  Administered 2019-03-15: 10 mL

## 2019-03-15 MED ORDER — OXYCODONE HCL 5 MG/5ML PO SOLN: 5.0000 mg | Freq: Once | ORAL | Status: AC | PRN

## 2019-03-15 MED ORDER — HYDROCODONE-ACETAMINOPHEN 5-325 MG PO TABS: 1.0000 | ORAL_TABLET | ORAL | 0 refills | Status: AC | PRN

## 2019-03-15 MED ORDER — ONDANSETRON 4 MG PO TBDP: 4.0000 mg | ORAL_TABLET | Freq: Three times a day (TID) | ORAL | 0 refills | Status: AC | PRN

## 2019-03-15 MED ORDER — GLYCOPYRROLATE 0.2 MG/ML IJ SOLN
INTRAMUSCULAR | Status: DC | PRN
  Administered 2019-03-15: .1 mg via INTRAVENOUS

## 2019-03-15 MED ORDER — OXYCODONE HCL 5 MG PO TABS
5.0000 mg | ORAL_TABLET | Freq: Once | ORAL | Status: AC | PRN
  Administered 2019-03-15: 5 mg via ORAL

## 2019-03-15 MED ORDER — ASPIRIN EC 325 MG PO TBEC: 325.0000 mg | DELAYED_RELEASE_TABLET | Freq: Every day | ORAL | 0 refills | Status: AC

## 2019-03-15 MED ORDER — DEXAMETHASONE SODIUM PHOSPHATE 4 MG/ML IJ SOLN
INTRAMUSCULAR | Status: DC | PRN
  Administered 2019-03-15: 4 mg via INTRAVENOUS

## 2019-03-15 MED ORDER — ACETAMINOPHEN 500 MG PO TABS: 1000.0000 mg | ORAL_TABLET | Freq: Three times a day (TID) | ORAL | 2 refills | Status: AC

## 2019-03-15 MED ORDER — PROPOFOL 10 MG/ML IV BOLUS
INTRAVENOUS | Status: DC | PRN
  Administered 2019-03-15: 140 mg via INTRAVENOUS

## 2019-03-15 MED ORDER — MEPERIDINE HCL 25 MG/ML IJ SOLN: 6.2500 mg | INTRAMUSCULAR | Status: DC | PRN

## 2019-03-15 MED ORDER — LACTATED RINGERS IV SOLN
100.0000 mL/h | INTRAVENOUS | Status: DC
  Administered 2019-03-15: 100 mL/h via INTRAVENOUS

## 2019-03-15 MED ORDER — BUPIVACAINE HCL 0.25 % IJ SOLN
INTRAMUSCULAR | Status: DC | PRN
  Administered 2019-03-15: 10 mL

## 2019-03-15 MED ORDER — ONDANSETRON HCL 4 MG/2ML IJ SOLN
INTRAMUSCULAR | Status: DC | PRN
  Administered 2019-03-15 (×2): 4 mg via INTRAVENOUS

## 2019-03-15 SURGICAL SUPPLY — 48 items
ANCHOR SUT BIO SW 4.75X19.1 (Anchor) ×2 IMPLANT
BLADE SURG SZ10 CARB STEEL (BLADE) ×3 IMPLANT
BNDG COHESIVE 4X5 TAN STRL (GAUZE/BANDAGES/DRESSINGS) ×3 IMPLANT
BNDG COHESIVE 6X5 TAN STRL LF (GAUZE/BANDAGES/DRESSINGS) ×2 IMPLANT
BNDG ELASTIC 6X5.8 VLCR STR LF (GAUZE/BANDAGES/DRESSINGS) ×4 IMPLANT
BNDG ESMARK 6X12 TAN STRL LF (GAUZE/BANDAGES/DRESSINGS) ×3 IMPLANT
CANISTER SUCT 1200ML W/VALVE (MISCELLANEOUS) ×3 IMPLANT
COOLER POLAR GLACIER W/PUMP (MISCELLANEOUS) ×3 IMPLANT
CUFF TOURN SGL QUICK 30 (TOURNIQUET CUFF) ×2
CUFF TRNQT CYL 30X4X21-28X (TOURNIQUET CUFF) IMPLANT
DECANTER SPIKE VIAL GLASS SM (MISCELLANEOUS) ×2 IMPLANT
DERMABOND ADVANCED (GAUZE/BANDAGES/DRESSINGS) ×2
DERMABOND ADVANCED .7 DNX12 (GAUZE/BANDAGES/DRESSINGS) IMPLANT
DRAPE SHEET LG 3/4 BI-LAMINATE (DRAPES) ×2 IMPLANT
DURAPREP 26ML APPLICATOR (WOUND CARE) ×2 IMPLANT
ELECT REM PT RETURN 9FT ADLT (ELECTROSURGICAL) ×3
ELECTRODE REM PT RTRN 9FT ADLT (ELECTROSURGICAL) ×1 IMPLANT
GAUZE SPONGE 4X4 12PLY STRL (GAUZE/BANDAGES/DRESSINGS) ×3 IMPLANT
GLOVE BIOGEL PI IND STRL 8 (GLOVE) ×1 IMPLANT
GLOVE BIOGEL PI INDICATOR 8 (GLOVE) ×4
GOWN STRL REUS W/ TWL LRG LVL3 (GOWN DISPOSABLE) ×1 IMPLANT
GOWN STRL REUS W/ TWL XL LVL3 (GOWN DISPOSABLE) ×1 IMPLANT
GOWN STRL REUS W/TWL LRG LVL3 (GOWN DISPOSABLE) ×2
GOWN STRL REUS W/TWL XL LVL3 (GOWN DISPOSABLE) ×2
KIT TURNOVER KIT A (KITS) ×3 IMPLANT
NDL MAYO CATGUT SZ4 (NEEDLE) ×3 IMPLANT
NDL MAYO CATGUT SZ4 TCR NDL (NEEDLE) IMPLANT
NS IRRIG 500ML POUR BTL (IV SOLUTION) ×2 IMPLANT
PACK EXTREMITY ARMC (MISCELLANEOUS) ×3 IMPLANT
PAD WRAPON POLAR KNEE (MISCELLANEOUS) ×1 IMPLANT
PADDING CAST 4IN STRL (MISCELLANEOUS) ×2
PADDING CAST BLEND 4X4 STRL (MISCELLANEOUS) IMPLANT
PADDING CAST BLEND 6X4 STRL (MISCELLANEOUS) IMPLANT
PADDING STRL CAST 6IN (MISCELLANEOUS) ×2
SPONGE LAP 18X18 RF (DISPOSABLE) ×3 IMPLANT
STOCKINETTE IMPERVIOUS LG (DRAPES) ×2 IMPLANT
SUT MNCRL 4-0 (SUTURE) ×2
SUT MNCRL 4-0 27XMFL (SUTURE) ×1
SUT VIC AB 0 CT1 36 (SUTURE) ×6 IMPLANT
SUT VIC AB 2-0 CT2 27 (SUTURE) ×2 IMPLANT
SUTURE MNCRL 4-0 27XMF (SUTURE) IMPLANT
SUTURE TAPE 1.3 40 TPR END (SUTURE) IMPLANT
SUTURETAPE 1.3 40 TPR END (SUTURE) ×6
SYR 20ML LL LF (SYRINGE) ×2 IMPLANT
SYR BULB IRRIG 60ML STRL (SYRINGE) ×2 IMPLANT
SYS INTERNAL BRACE KNEE (Miscellaneous) ×3 IMPLANT
SYSTEM INTERNAL BRACE KNEE (Miscellaneous) IMPLANT
WRAPON POLAR PAD KNEE (MISCELLANEOUS) ×3

## 2019-03-15 NOTE — Anesthesia Procedure Notes (Signed)
Procedure Name: LMA Insertion Date/Time: 03/15/2019 1:26 PM Performed by: Jimmy Picket, CRNA Pre-anesthesia Checklist: Patient identified, Emergency Drugs available, Suction available, Timeout performed and Patient being monitored Patient Re-evaluated:Patient Re-evaluated prior to induction Oxygen Delivery Method: Circle system utilized Preoxygenation: Pre-oxygenation with 100% oxygen Induction Type: IV induction LMA: LMA inserted LMA Size: 4.0 Number of attempts: 1 Placement Confirmation: positive ETCO2 and breath sounds checked- equal and bilateral Tube secured with: Tape

## 2019-03-15 NOTE — Transfer of Care (Signed)
Immediate Anesthesia Transfer of Care Note  Patient: Scott Murray  Procedure(s) Performed: LEFT QUADRICEPS TENDON REPAIR (Left Leg Upper)  Patient Location: PACU  Anesthesia Type: General  Level of Consciousness: awake, alert  and patient cooperative  Airway and Oxygen Therapy: Patient Spontanous Breathing and Patient connected to supplemental oxygen  Post-op Assessment: Post-op Vital signs reviewed, Patient's Cardiovascular Status Stable, Respiratory Function Stable, Patent Airway and No signs of Nausea or vomiting  Post-op Vital Signs: Reviewed and stable  Complications: No apparent anesthesia complications

## 2019-03-15 NOTE — H&P (Signed)
Paper H&P to be scanned into permanent record. H&P reviewed. No significant changes noted.  

## 2019-03-15 NOTE — Discharge Instructions (Signed)
Post-Op Instructions - Quadriceps Tendon Repair  1. Bracing or crutches: You will be provided with a long brace (from hip to ankle) and crutches.  2. Ice: You will be provided with a device [facility]) that allows you to ice the affected area effectively.   3. Showering: Incision must remain dry for 5 days. Afterwards, you may shower and gently pat incision dry. NO submerging wound for 4 weeks. You have an absorbable stitch with glue over the incision that will fall off over time.  4. Driving: You will be given specific driving precautions at discharge. Plan on not driving for at least one week for left knee surgery, and 4-6 weeks for right knee surgery if you are restricted due to the brace and knee motion. Please note that you are advised NOT to drive while taking narcotic pain medications as you may be impaired and unsafe to drive.  5. Activity: Weight bearing: Weight bearing as tolerated with brace locked in extension. Bending the knee is limited and will be guided by the physical therapist. Elevate knee above heart level as much as possible for one week. Avoid standing more than 5 minutes (consecutively) for the first week. No exercise involving the knee until cleared by the surgeon or physical therapist.  Avoid long distance travel for 4 weeks.  6. Medications: - You have been provided a prescription for narcotic pain medicine. After surgery, take 1-2 narcotic tablets every 4 hours if needed for severe pain.  - A prescription for anti-nausea medication will be provided in case the narcotic medicine causes nausea - take 1 tablet every 6 hours only if nauseated.  - Take aspirin 325mg  daily for 4 weeks to prevent blood clots.  -Take tylenol 1000 every 8 hours for pain.  May stop tylenol 3 days after surgery or when you are having minimal pain. -DO NOT TAKE IBUPROFEN, ALEVE or OTHER NSAIDs as they can interfere with bone healing.    If you are taking prescription medication for anxiety,  depression, insomnia, muscle spasm, chronic pain, or for attention deficit disorder, you are advised that you are at a higher risk of adverse effects with use of narcotics post-op, including narcotic addiction/dependence, depressed breathing, death. If you use non-prescribed substances: alcohol, marijuana, cocaine, heroin, methamphetamines, etc., you are at a higher risk of adverse effects with use of narcotics post-op, including narcotic addiction/dependence, depressed breathing, death. You are advised that taking > 50 morphine milligram equivalents (MME) of narcotic pain medication per day results in twice the risk of overdose or death. For your prescription provided: oxycodone 5 mg - taking more than 6 tablets per day would result in > 50 morphine milligram equivalents (MME) of narcotic pain medication. Be advised that we will prescribe narcotics short-term, for acute post-operative pain, only 3 weeks for major operations such as knee repair/reconstruction surgeries.   7. Bandages: The physical therapist should change the bandages at the first post-op appointment. If needed, the dressing supplies have been provided to you.  8. Physical Therapy: 2 times per week for the first 4 weeks, then 1-2 times per week from weeks 4-8 post-op. Therapy typically starts on post operative Day 3 or 4. You have been provided an order for physical therapy today and should schedule your appointments in advance to avoid delay. The therapist will provide home exercises.  9. Work or School: For most, but not all procedures, we advise staying out of work or school for at least 1 to 2 weeks in order to  recover from the stress of surgery and to allow time for healing and swelling control. If you need a work or school note this can be provided.   10. Post-Op Appointments: Your first post-op appointment will be with Dr. Allena Katz in approximately 2 weeks time. Please double check if this will be at the [facility] facility (Tuesdays and  Thursdays) or [facility] facility (Wednesdays).    If you find that they have not been scheduled please call the Orthopaedic Appointment front desk at [phone].   Information for Discharge Teaching: EXPAREL (bupivacaine liposome injectable suspension)   Your surgeon or anesthesiologist gave you EXPAREL(bupivacaine) to help control your pain after surgery.   EXPAREL is a local anesthetic that provides pain relief by numbing the tissue around the surgical site.  EXPAREL is designed to release pain medication over time and can control pain for up to 72 hours.  Depending on how you respond to EXPAREL, you may require less pain medication during your recovery.  Possible side effects:  Temporary loss of sensation or ability to move in the area where bupivacaine was injected.  Nausea, vomiting, constipation  Rarely, numbness and tingling in your mouth or lips, lightheadedness, or anxiety may occur.  Call your doctor right away if you think you may be experiencing any of these sensations, or if you have other questions regarding possible side effects.  Follow all other discharge instructions given to you by your surgeon or nurse. Eat a healthy diet and drink plenty of water or other fluids.  If you return to the hospital for any reason within 96 hours following the administration of EXPAREL, it is important for health care providers to know that you have received this anesthetic. A teal colored band has been placed on your arm with the date, time and amount of EXPAREL you have received in order to alert and inform your health care providers. Please leave this armband in place for the full 96 hours following administration, and then you may remove the band.   General Anesthesia, Adult, Care After This sheet gives you information about how to care for yourself after your procedure. Your health care provider may also give you more specific instructions. If you have problems or questions,  contact your health care provider. What can I expect after the procedure? After the procedure, the following side effects are common:  Pain or discomfort at the IV site.  Nausea.  Vomiting.  Sore throat.  Trouble concentrating.  Feeling cold or chills.  Weak or tired.  Sleepiness and fatigue.  Soreness and body aches. These side effects can affect parts of the body that were not involved in surgery. Follow these instructions at home:  For at least 24 hours after the procedure:  Have a responsible adult stay with you. It is important to have someone help care for you until you are awake and alert.  Rest as needed.  Do not: ? Participate in activities in which you could fall or become injured. ? Drive. ? Use heavy machinery. ? Drink alcohol. ? Take sleeping pills or medicines that cause drowsiness. ? Make important decisions or sign legal documents. ? Take care of children on your own. Eating and drinking  Follow any instructions from your health care provider about eating or drinking restrictions.  When you feel hungry, start by eating small amounts of foods that are soft and easy to digest (bland), such as toast. Gradually return to your regular diet.  Drink enough fluid to keep your urine  pale yellow.  If you vomit, rehydrate by drinking water, juice, or clear broth. General instructions  If you have sleep apnea, surgery and certain medicines can increase your risk for breathing problems. Follow instructions from your health care provider about wearing your sleep device: ? Anytime you are sleeping, including during daytime naps. ? While taking prescription pain medicines, sleeping medicines, or medicines that make you drowsy.  Return to your normal activities as told by your health care provider. Ask your health care provider what activities are safe for you.  Take over-the-counter and prescription medicines only as told by your health care provider.  If you  smoke, do not smoke without supervision.  Keep all follow-up visits as told by your health care provider. This is important. Contact a health care provider if:  You have nausea or vomiting that does not get better with medicine.  You cannot eat or drink without vomiting.  You have pain that does not get better with medicine.  You are unable to pass urine.  You develop a skin rash.  You have a fever.  You have redness around your IV site that gets worse. Get help right away if:  You have difficulty breathing.  You have chest pain.  You have blood in your urine or stool, or you vomit blood. Summary  After the procedure, it is common to have a sore throat or nausea. It is also common to feel tired.  Have a responsible adult stay with you for the first 24 hours after general anesthesia. It is important to have someone help care for you until you are awake and alert.  When you feel hungry, start by eating small amounts of foods that are soft and easy to digest (bland), such as toast. Gradually return to your regular diet.  Drink enough fluid to keep your urine pale yellow.  Return to your normal activities as told by your health care provider. Ask your health care provider what activities are safe for you. This information is not intended to replace advice given to you by your health care provider. Make sure you discuss any questions you have with your health care provider. Document Revised: 01/13/2017 Document Reviewed: 08/26/2016 Elsevier Patient Education  [place].

## 2019-03-15 NOTE — Anesthesia Postprocedure Evaluation (Signed)
Anesthesia Post Note  Patient: Scott Murray  Procedure(s) Performed: LEFT QUADRICEPS TENDON REPAIR (Left Leg Upper)     Patient location during evaluation: PACU Anesthesia Type: General Level of consciousness: awake and alert Pain management: pain level controlled Vital Signs Assessment: post-procedure vital signs reviewed and stable Respiratory status: spontaneous breathing, nonlabored ventilation, respiratory function stable and patient connected to nasal cannula oxygen Cardiovascular status: blood pressure returned to baseline and stable Postop Assessment: no apparent nausea or vomiting Anesthetic complications: no    Scott Murray  Scott Murray

## 2019-03-15 NOTE — Op Note (Signed)
DATE OF SURGERY: 03/15/2019  PRE-OP DIAGNOSIS: Left Quadricpes Tendon Rupture   POST-OP DIAGNOSIS: Left Quadriceps Tendon Rupture  PROCEDURES: Left Quadriceps Tendon Repair  SURGEON: Cato Mulligan, MD  ASSISTANT(S): none  ANESTHESIA: regional + Gen  TOTAL IV FLUIDS: see anesthesia record  ESTIMATED BLOOD LOSS: 5cc  TOURNIQUET TIME: 90 min  DRAINS:  none  SPECIMENS: None.  IMPLANTS: Arthrex 4.36m SwiveLock anchors x 3  COMPLICATIONS: None apparent.  INDICATIONS: Scott E BMcglincheyis a 49y.o. male with a quadriceps tendon rupture, injured while at work after stepping off a height of approximately 18 inches. Physical exam was notable for an obvious gap in the quadriceps tendon just superior to the patella.  The patient was unable to perform a straight leg raise.  Imaging confirmed complete rupture of the quadriceps tendon. After discussion of risks, benefits, and alternatives to surgery, the patient elected to proceed with quadriceps tendon repair.  DETAILS OF PROCEDURE: Scott E BMabeywas met in the preoperative holding area and informed consent was verified.  The patient was brought to the operating room and placed supine on the table. Anesthesia was administered. Leg was prescrubbed with Hibiclens and alcohol, prepped with ChloraPrep and draped in the usual sterile fashion. The patient was given preoperative IV antibiotics within 30 minutes of the start of the case, and a surgical time-out occurred. A well-padded tourniquet was placed.   The leg was elevated, exsanguinated with an Esmarch bandage and tourniquet inflated to 2523mg. A midline incision was created on the knee from the superior pole of the patella t approximately 8 cm superiorly. The retinacular layer was developed, medial and lateral, in line with the skin incision. At that point, an obvious tear of the quadriceps tendon was notable. Medial and lateral retinacular tears were also identified. Edges of the quadriceps  tendon were debrided to healthy tendon. The superior patella was prepared by removing soft tissue proximally and any small bony fragments. I then created a small trough with a rongeur. Two FiberTape sutures were then placed in the proximal patellar tendon with medial Krackow stitch and a lateral Krackow stitch. A 3.40m16mrill bit was used to make a 240m76mng drill hole in the superior patella at the medial 1/3 junction of the patella. The hole was tapped twice in preparation of insertion of a 4.740mm31mveLock anchor.  Another drill hole was made at the lateral one third junction of the patella and tapped in a similar fashion.  Given the distance between these holes, a third central drill hole was also made and tapped.  Additionally suture tape was placed in a Krakw fashion in between the prior medial and lateral Krakw sutures.  The medial FiberTape sutures were loaded into a 4.740mm 18meLock anchor and the anchor was inserted with the appropriate amount of tension with the leg in full extension. This process was repeated for the middle suture tape and lateral FiberTape sutures with placement into the middle and lateral anchors. This construct appropriately reduced the quadriceps tendon to the superior pole of the patella. The wound was thoroughly irrigated at this point.  The medial and lateral retinacular layers were closed with 0 Vicryl suture in a figure of 8 fashion.  The FiberWire sutures from the anchors were also placed across the region of the prior quadriceps tendon tear for further reinforcement and tied.  The wound was irrigated again.  The patient could reach ~50 degrees of flexion before there was a significant increase in tension.  There was  no gap formation between the superior patella and quadriceps tendon. 2-0 Vicryl was used to close the subdermal layer tissue.  4-0 Monocryl and Dermabond were used to close skin.  Sterile dressing, PolarCare, and hinged knee brace locked at 0 degrees were applied.  Instrument, sponge, and needle counts were correct prior to wound closure and at the conclusion of the case. The patient was then awakened from anesthesia without complication.   POST-OPERATIVE PLAN: - ASA 323m/day x for DVT ppx - WBAT on operative lower extremity with brace locked in extension x 6 weeks - PT/OT to start on POD #3-4 - Follow-up with me in approximately 2 weeks   REHAB PROTOCOL    GOALS:  .A/AAROM 90-100 degrees by 6 weeks, 0-110 degrees by week 8, 0-130 degreesby week 10, and 0-135 degrees by week 12.  Week 1-4  No active ROM knee extension.  .Marland KitchenROM knee ext to 0 degrees .AROM/AAROM knee flexion - very gently - Safe range as determined in Operative note (amount of tension-free repair). 45 .Gradually unlock brace for sitting as PROM knee flexion improves  Exercises:  .Ankle pumps .Patellar mobilizations .Hamstring stretch sitting .Gastroc stretch with towel .Heelslides .Quad sets - may add E-stim for re-education at 2-3 weeks upon MD approval .Patellar mobilization - all directions. .SLR all directions, active assistive flexion- start at 3rd post-op week - do notallow lag - use e-stim as needed after 2-3 weeks. If unable to achieve fullextension, perform SLR in knee immobilizer  Week 5: Gradually increase A/AAROM knee flexion  Exercises:  .Submaximal multi-angle isometrics (30-50% only) .Continue knee flexion ROM - rocking chair at home .Active SLR 4 way - no weight for flexion - watch for extensor lag - increaseresistance for hip abduction, adduction, and extension.  Add aquatic therapy if available. Move slowly so water is assistive and not resistive  Aquatic therapy exercises:  .With knee submerged in water, knee dangling at 80-90 degrees - slowly activelyextend knee to 0 degrees. .Water walking in chest deep water .SLR 4 way in the water with knee straight .Knee flexion in water  Week 6-8:  Brace - unlock for sitting to 90 degrees at 6 weeks. If quad  control sufficient at 8 weeks unlock brace 0-90 degrees for ambulation with bilateral axillary crutches and gradually open brace as ROM improves. Progress to ambulation at 8 weeks with no crutches as quadriceps strength allows. D/C crutches and brace at 8-12 weeks depending on patient's quadriceps control. Emphasize frequent ROM exercises  Goals - Gradually increase P/A/AAROM during weeks 6-8   Exercises:  .Total gym semi squats level 3-4 .Gradually increase weight on all SLR, if no lag present .Week 6 - bike (begin with rocking and progress to full revolutions) .Week 6 - Closed chain terminal knee extension with theraband .Week 6 - SAQ (AROM) .Week 7 - LAQ (AROM) .Week 8 - SAQ (gradually increase resistance) .Week 8 - LAQ (gradually increase resistance) .Week 8 - weight shifts .Week 8 - balance master and/or BAPS - with bilateral LE weight bearing .Week 8 - cones  Week 9-10:  Exercises:  .Total gym level 5-6 .Bilateral leg press - concentric only - no significant load work until 12 weeks.  .Weight shift on minitramp .Toe rises .Treadmill - Concentrate on pattern with eccentric knee control Week 11-16:  Exercises:  .Leg press - Gradually increase weight and begin unilateral leg press at week 12 .Wall squats .Balance activities: unilateral stance eyes open and closed, balance master .Standing minisquats .Step-ups - start concentrically,  2" to start and progress as tolerated .Week 16 - lunges .Week 16 - stairclimber/elliptical machine  CRITERIA TO START RUNNING PROGRAM  .Patient is able to walk with a normal gait pattern for at least 20 minutes withoutsymptoms and performs ADL's painfree .ROM is equal to uninvolved side, or at least 0-125 degrees .Hamstring and quadriceps strength is 70% of the uninvolved side isokinetically .Patient without pain, edema, crepitus, or giving-way    POST-OPERATIVE PLAN: - ASA 313m/day x 4 weeks for DVT ppx - WBAT on operative lower extremity  with brace locked in extension x 6 weeks - PT/OT to start on POD #3-4 - Follow-up with me in approximately 2 weeks   REHAB PROTOCOL    GOALS:  .A/AAROM 90-100 degrees by 6 weeks, 0-110 degrees by week 8, 0-130 degreesby week 10, and 0-135 degrees by week 12.  Week 1-4  No active ROM knee extension.  .Marland KitchenROM knee ext to 0 degrees .AROM/AAROM knee flexion - very gently - Safe range as determined in Operative note (amount of tension-free repair). 45 .Gradually unlock brace for sitting as PROM knee flexion improves  Exercises:  .Ankle pumps .Patellar mobilizations .Hamstring stretch sitting .Gastroc stretch with towel .Heelslides .Quad sets - may add E-stim for re-education at 2-3 weeks upon MD approval .Patellar mobilization - all directions. .SLR all directions, active assistive flexion- start at 3rd post-op week - do notallow lag - use e-stim as needed after 2-3 weeks. If unable to achieve fullextension, perform SLR in knee immobilizer  Week 5: Gradually increase A/AAROM knee flexion  Exercises:  .Submaximal multi-angle isometrics (30-50% only) .Continue knee flexion ROM - rocking chair at home .Active SLR 4 way - no weight for flexion - watch for extensor lag - increaseresistance for hip abduction, adduction, and extension.  Add aquatic therapy if available. Move slowly so water is assistive and not resistive  Aquatic therapy exercises:  .With knee submerged in water, knee dangling at 80-90 degrees - slowly activelyextend knee to 0 degrees. .Water walking in chest deep water .SLR 4 way in the water with knee straight .Knee flexion in water  Week 6-8:  Brace - unlock for sitting to 90 degrees at 6 weeks. If quad control sufficient at 8 weeks unlock brace 0-90 degrees for ambulation with bilateral axillary crutches and gradually open brace as ROM improves. Progress to ambulation at 8 weeks with no crutches as quadriceps strength allows. D/C crutches and brace at 8-12 weeks  depending on patient's quadriceps control. Emphasize frequent ROM exercises  Goals - Gradually increase P/A/AAROM during weeks 6-8   Exercises:  .Total gym semi squats level 3-4 .Gradually increase weight on all SLR, if no lag present .Week 6 - bike (begin with rocking and progress to full revolutions) .Week 6 - Closed chain terminal knee extension with theraband .Week 6 - SAQ (AROM) .Week 7 - LAQ (AROM) .Week 8 - SAQ (gradually increase resistance) .Week 8 - LAQ (gradually increase resistance) .Week 8 - weight shifts .Week 8 - balance master and/or BAPS - with bilateral LE weight bearing .Week 8 - cones  Week 9-10:  Exercises:  .Total gym level 5-6 .Bilateral leg press - concentric only - no significant load work until 12 weeks.  .Weight shift on minitramp .Toe rises .Treadmill - Concentrate on pattern with eccentric knee control Week 11-16:  Exercises:  .Leg press - Gradually increase weight and begin unilateral leg press at week 12 .Wall squats .Balance activities: unilateral stance eyes open and closed, balance master .Standing minisquats .  Step-ups - start concentrically, 2" to start and progress as tolerated .Week 16 - lunges .Week 16 - stairclimber/elliptical machine  CRITERIA TO START RUNNING PROGRAM  .Patient is able to walk with a normal gait pattern for at least 20 minutes withoutsymptoms and performs ADL's painfree .ROM is equal to uninvolved side, or at least 0-125 degrees .Hamstring and quadriceps strength is 70% of the uninvolved side isokinetically .Patient without pain, edema, crepitus, or giving-way

## 2019-03-18 ENCOUNTER — Encounter: Payer: Self-pay | Admitting: *Deleted
